# Patient Record
Sex: Female | Born: 1945 | Race: White | Hispanic: No | Marital: Married | Smoking: Never smoker
Health system: Southern US, Community
[De-identification: ages and names within clinical notes are randomized; demographics above are authoritative.]

## PROBLEM LIST (undated history)

## (undated) DIAGNOSIS — E079 Disorder of thyroid, unspecified: Secondary | ICD-10-CM

## (undated) DIAGNOSIS — E039 Hypothyroidism, unspecified: Secondary | ICD-10-CM

## (undated) DIAGNOSIS — M1990 Unspecified osteoarthritis, unspecified site: Secondary | ICD-10-CM

## (undated) DIAGNOSIS — I10 Essential (primary) hypertension: Secondary | ICD-10-CM

## (undated) HISTORY — PX: JOINT REPLACEMENT: SHX530

---

## 2013-02-16 ENCOUNTER — Emergency Department: Payer: Self-pay | Admitting: Emergency Medicine

## 2013-02-16 LAB — URINALYSIS, COMPLETE
Bilirubin,UR: NEGATIVE
Glucose,UR: NEGATIVE mg/dL (ref 0–75)
Ketone: NEGATIVE
Nitrite: NEGATIVE
Protein: 100
RBC,UR: 1089 /HPF (ref 0–5)
Specific Gravity: 1.005 (ref 1.003–1.030)
Squamous Epithelial: <1

## 2013-08-05 ENCOUNTER — Ambulatory Visit: Payer: Self-pay | Admitting: Family Medicine

## 2015-03-04 ENCOUNTER — Encounter (HOSPITAL_COMMUNITY): Payer: Self-pay | Admitting: Emergency Medicine

## 2015-03-04 ENCOUNTER — Emergency Department (HOSPITAL_COMMUNITY)
Admission: EM | Admit: 2015-03-04 | Discharge: 2015-03-04 | Disposition: A | Discharge status: Home / Self Care | Payer: Medicare Other | Attending: Emergency Medicine | Admitting: Emergency Medicine

## 2015-03-04 DIAGNOSIS — Z8639 Personal history of other endocrine, nutritional and metabolic disease: Secondary | ICD-10-CM | POA: Diagnosis not present

## 2015-03-04 DIAGNOSIS — N390 Urinary tract infection, site not specified: Secondary | ICD-10-CM | POA: Diagnosis not present

## 2015-03-04 DIAGNOSIS — I10 Essential (primary) hypertension: Secondary | ICD-10-CM | POA: Diagnosis not present

## 2015-03-04 DIAGNOSIS — R350 Frequency of micturition: Secondary | ICD-10-CM | POA: Diagnosis present

## 2015-03-04 DIAGNOSIS — Z8739 Personal history of other diseases of the musculoskeletal system and connective tissue: Secondary | ICD-10-CM | POA: Insufficient documentation

## 2015-03-04 DIAGNOSIS — E039 Hypothyroidism, unspecified: Secondary | ICD-10-CM | POA: Insufficient documentation

## 2015-03-04 HISTORY — DX: Unspecified osteoarthritis, unspecified site: M19.90

## 2015-03-04 HISTORY — DX: Disorder of thyroid, unspecified: E07.9

## 2015-03-04 HISTORY — DX: Essential (primary) hypertension: I10

## 2015-03-04 HISTORY — DX: Hypothyroidism, unspecified: E03.9

## 2015-03-04 LAB — URINALYSIS, ROUTINE W REFLEX MICROSCOPIC
Bilirubin Urine: NEGATIVE
Glucose, UA: NEGATIVE mg/dL
Ketones, ur: NEGATIVE mg/dL
Nitrite: NEGATIVE
PH: 6.5 (ref 5.0–8.0)
Protein, ur: NEGATIVE mg/dL
SPECIFIC GRAVITY, URINE: 1.008 (ref 1.005–1.030)
UROBILINOGEN UA: 0.2 mg/dL (ref 0.0–1.0)

## 2015-03-04 LAB — URINE MICROSCOPIC-ADD ON

## 2015-03-04 MED ORDER — CEPHALEXIN 500 MG PO CAPS: 500.0000 mg | ORAL_CAPSULE | Freq: Four times a day (QID) | ORAL | Status: DC

## 2015-03-04 MED ORDER — CEPHALEXIN 250 MG PO CAPS
1000.0000 mg | ORAL_CAPSULE | Freq: Once | ORAL | Status: AC
  Administered 2015-03-04: 1000 mg via ORAL
  Filled 2015-03-04: qty 4

## 2015-03-04 NOTE — Discharge Instructions (Signed)

## 2015-03-04 NOTE — ED Notes (Signed)
Pt reports dysuria and painful urination. Pt states that s/s started yesterday. Pt reports hx of UTI's. Pt denied hematuria. Pt ambulatory to triage, NAD, and oriented at this time in triage.

## 2015-03-04 NOTE — ED Provider Notes (Signed)
CSN: [id number]     Arrival date & time 03/04/15  2002 History   First MD Initiated Contact with Patient 03/04/15 2019     Chief Complaint  Patient presents with  . Urinary Frequency     (Consider location/radiation/quality/duration/timing/severity/associated sxs/prior Treatment) HPI   69 year old female with history of thyroid disease, hypertension presents for evaluation of dysuria. Patient reports gradual onset of burning urination along with urinary frequency and urgency which started yesterday. she has had prior history of UTI and this felt similar. She denies any associated fever, abdominal pain, back pain, hematuria, or rash. Her husband is in the ICU here for the past 4 days and patient decided to come to ED for evaluation of her sxs while she's here. She denies any specific treatment tried.  Past Medical History  Diagnosis Date  . Hypertension   . Thyroid disease   . Hypothyroidism   . Arthritis   . Hypothyroidism hypo   Past Surgical History  Procedure Laterality Date  . Joint replacement  right knee   No family history on file. Social History  Substance Use Topics  . Smoking status: Never Smoker   . Smokeless tobacco: None  . Alcohol Use: 0.6 oz/week    1 Glasses of wine per week   OB History    No data available     Review of Systems  All other systems reviewed and are negative.     Allergies  Review of patient's allergies indicates no known allergies.  Home Medications   Prior to Admission medications   Not on File   BP 165/81 mmHg  Pulse 78  Temp(Src) 97.4 F (36.3 C) (Oral)  Resp 18  Ht 5\' 2"  (1.575 m)  Wt 143 lb (64.864 kg)  BMI 26.15 kg/m2  SpO2 100% Physical Exam  Constitutional: She is oriented to person, place, and time. She appears well-developed and well-nourished. No distress.  Caucasian female, appears stated age, in no acute distress and nontoxic in appearance  HENT:  Head: Atraumatic.  Eyes: Conjunctivae are normal.  Neck:  Neck supple.  Cardiovascular: Normal rate and regular rhythm.   Pulmonary/Chest: Effort normal and breath sounds normal.  Abdominal: Soft. There is no tenderness.  Genitourinary:  No CVA tenderness  Neurological: She is alert and oriented to person, place, and time.  Skin: No rash noted.  Psychiatric: She has a normal mood and affect.  Nursing note and vitals reviewed.   ED Course  Procedures (including critical care time)  Well appearing female here with symptoms of dysuria concerning for urinary tract infection. She is able to give a good history and does not have any other concerning symptoms. She is afebrile with stable vital sign. She is hypertensive with history of hypertension and did not take her medication today. She will take it tonight.  9:03 PM Urine result is consistent with a UTI.  Pt will be treated with keflex.  Urine culture sent.    Labs Review Labs Reviewed  URINALYSIS, ROUTINE W REFLEX MICROSCOPIC (NOT AT [facility]) - Abnormal; Notable for the following:    APPearance CLOUDY (*)    Hgb urine dipstick MODERATE (*)    Leukocytes, UA LARGE (*)    All other components within normal limits  URINE MICROSCOPIC-ADD ON - Abnormal; Notable for the following:    Squamous Epithelial / LPF FEW (*)    Bacteria, UA MANY (*)    All other components within normal limits  URINE CULTURE    Imaging Review No  results found. I have personally reviewed and evaluated these images and lab results as part of my medical decision-making.   EKG Interpretation None      MDM   Final diagnoses:  UTI (lower urinary tract infection)    BP 165/81 mmHg  Pulse 78  Temp(Src) 97.4 F (36.3 C) (Oral)  Resp 18  Ht  (1.575 m)  Wt 143 lb (64.864 kg)  BMI 26.15 kg/m2  SpO2 100%     Fayrene Helper, PA-C 03/04/15 2103  Fayrene Helper, PA-C 03/04/15 2109  Linwood Dibbles, MD 03/04/15 [phone]

## 2015-03-07 LAB — URINE CULTURE
Culture: >=100000
Special Requests: NORMAL

## 2015-03-08 ENCOUNTER — Telehealth (HOSPITAL_COMMUNITY): Payer: Self-pay

## 2015-03-08 NOTE — Telephone Encounter (Signed)
Post ED Visit - Positive Culture Follow-up  Culture report reviewed by antimicrobial stewardship pharmacist:  []  Celedonio MiyamotoJeremy Frens, Pharm.D., BCPS []  Georgina PillionElizabeth Martin, [address] BCPS []  [place] HeightsMinh Pham, [address]Ph[address]., BCPS, AAHIVP []  Estella HuskMichelle Turner, Pharm.D., BCPS, AAHIVP []  [place]unen[place]gristy Reyes, [address] [x] Gretel AcreNick Gazda  Cassie Stewart, [place]arm[place]D.  Positive urine culture Treated with cephalexin, organism sensitive to the same and no further patient follow-up is required at this time.  Ashley JacobsFesterman, Terez Montee C 03/08/2015, 9:37 AM

## 2015-09-11 IMAGING — US ABDOMEN ULTRASOUND
1 series · 14 of 25 positions shown · non-contrast
Comparison: None.

CLINICAL DATA: Abdomen pain.  Evaluate gallstones.

EXAM:
ULTRASOUND ABDOMEN COMPLETE

[Series 1: abdomen ultrasound · 0.28mm/px · 14 of 114 slices shown]
[im 1/114]
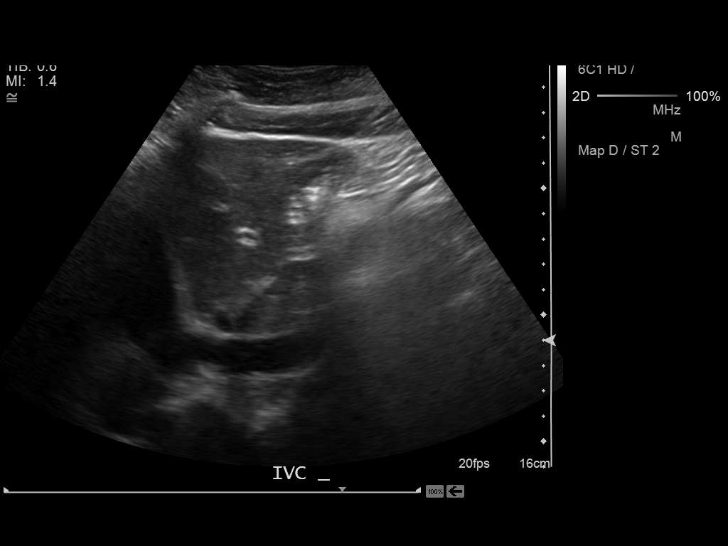
[im 10/114]
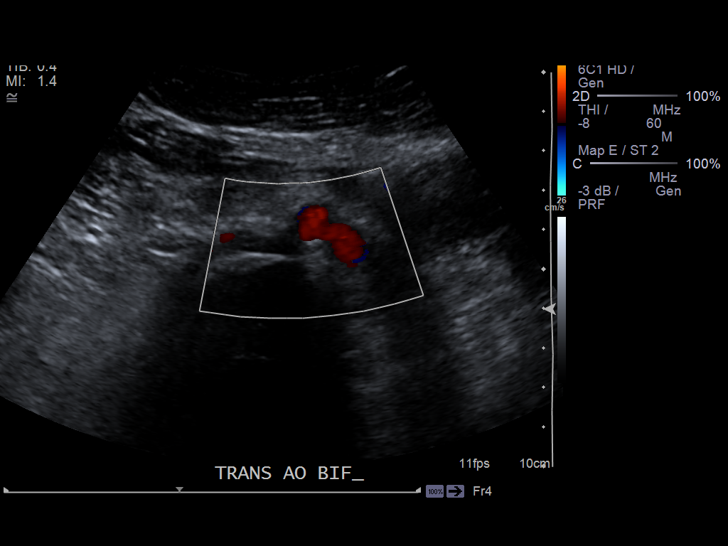
[im 19/114]
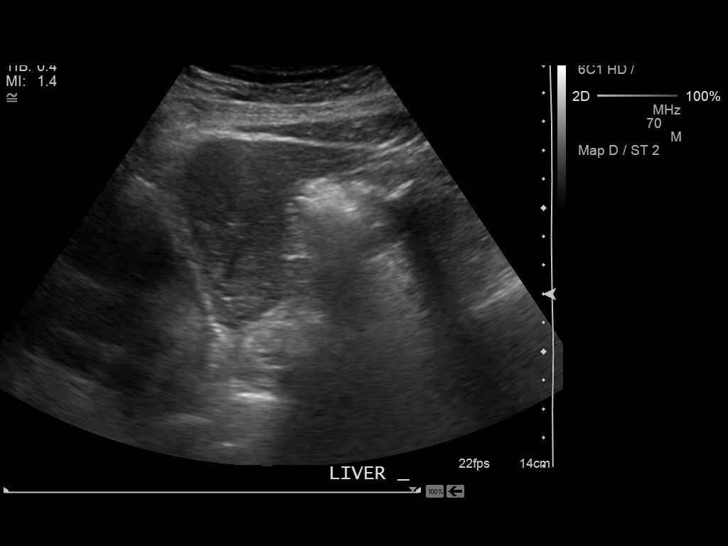
[im 29/114]
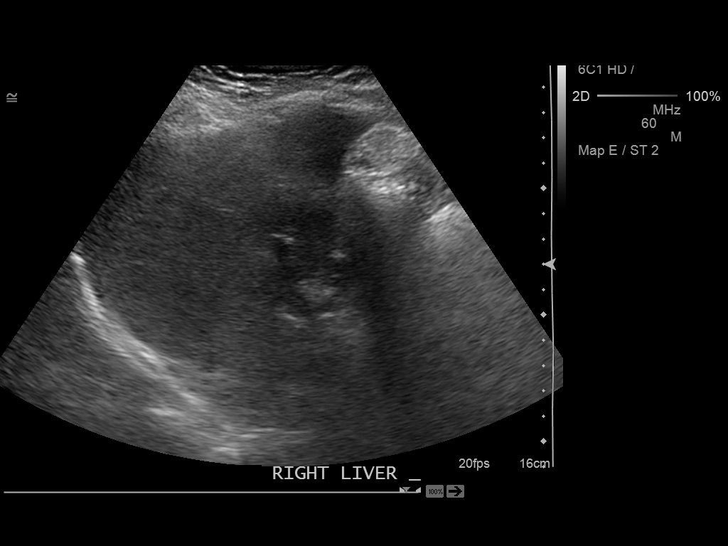
[im 38/114]
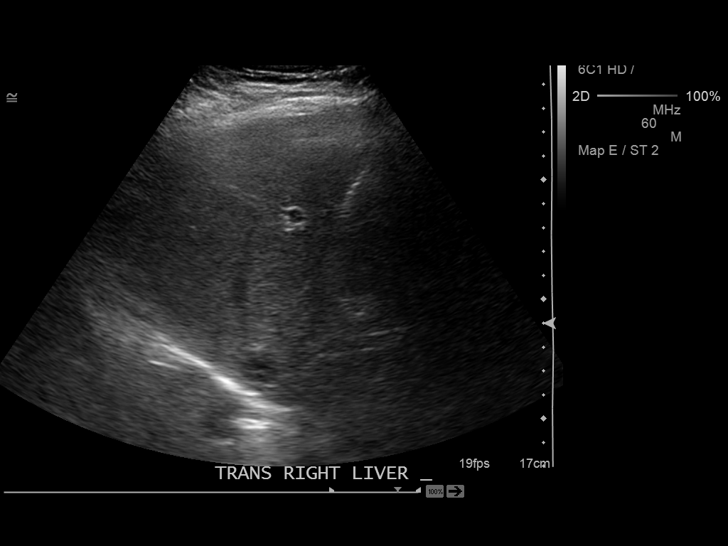
[im 43/114]
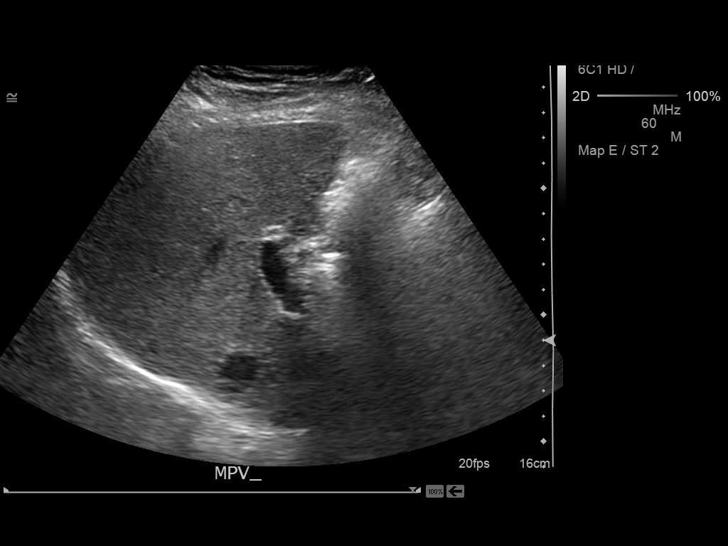
[im 52/114]
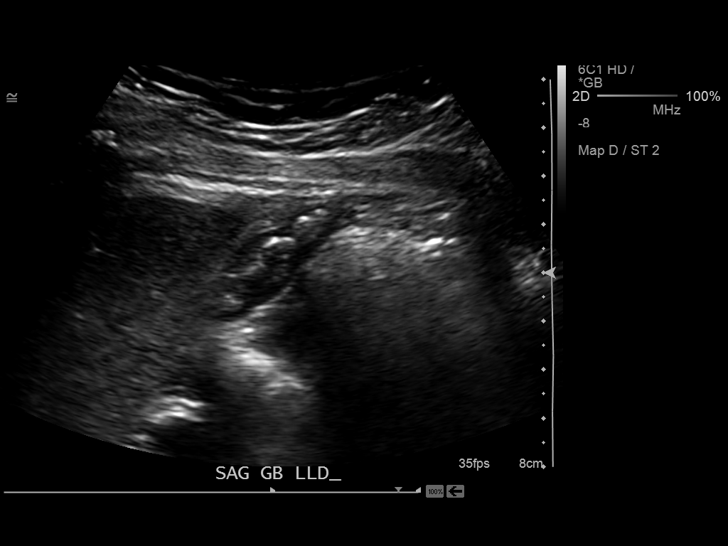
[im 62/114]
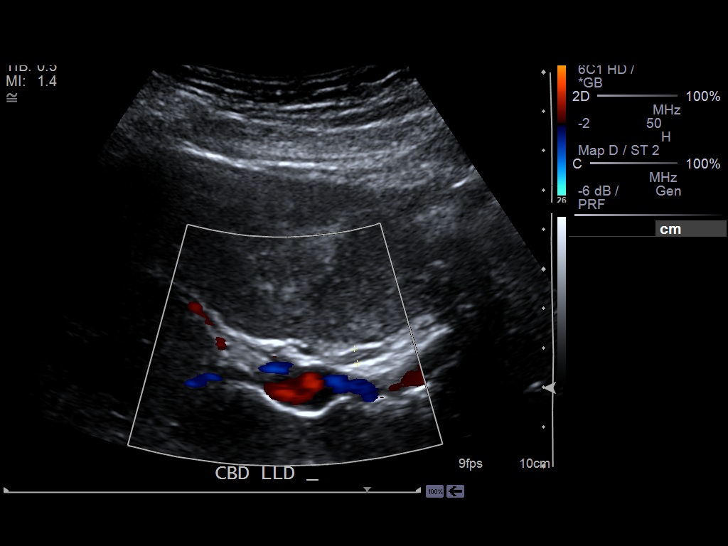
[im 71/114]
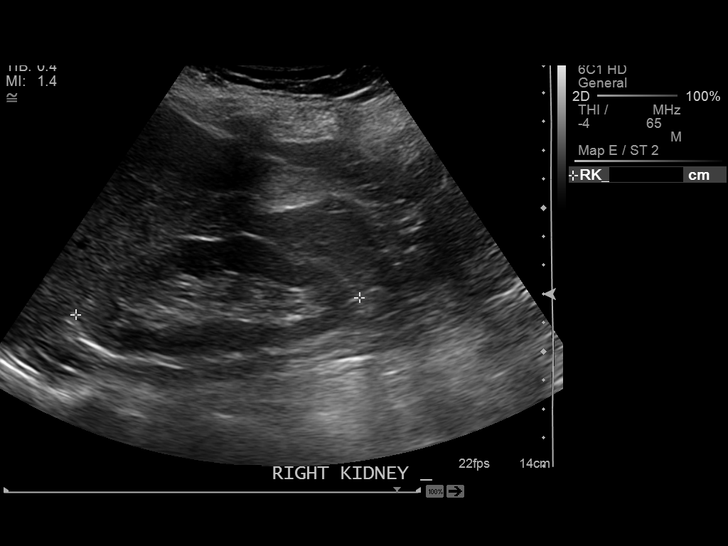
[im 76/114]
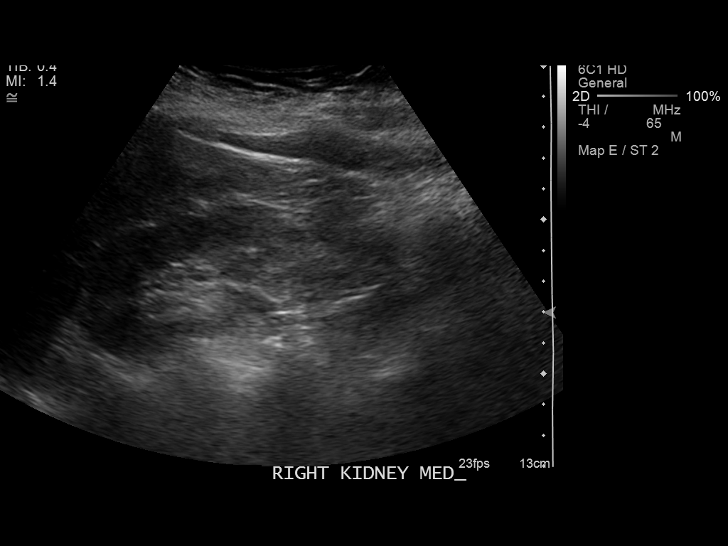
[im 85/114]
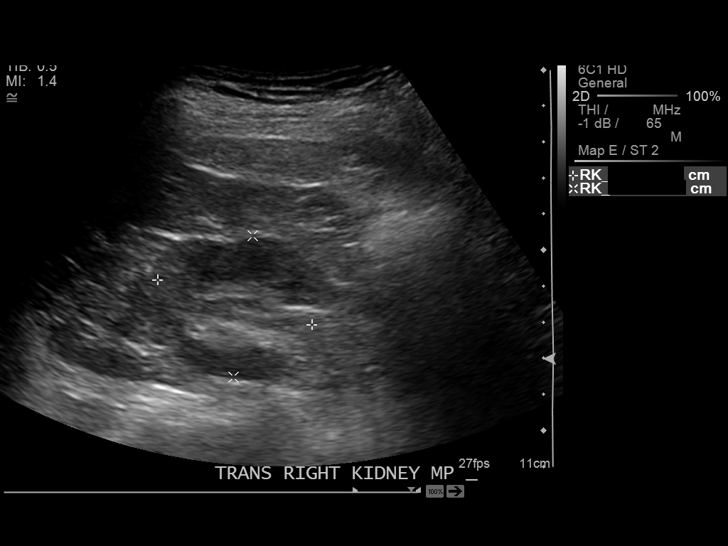
[im 95/114]
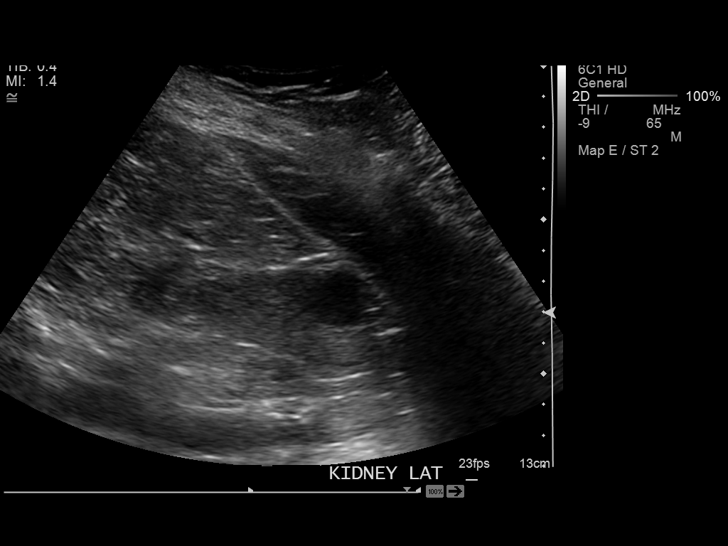
[im 104/114]
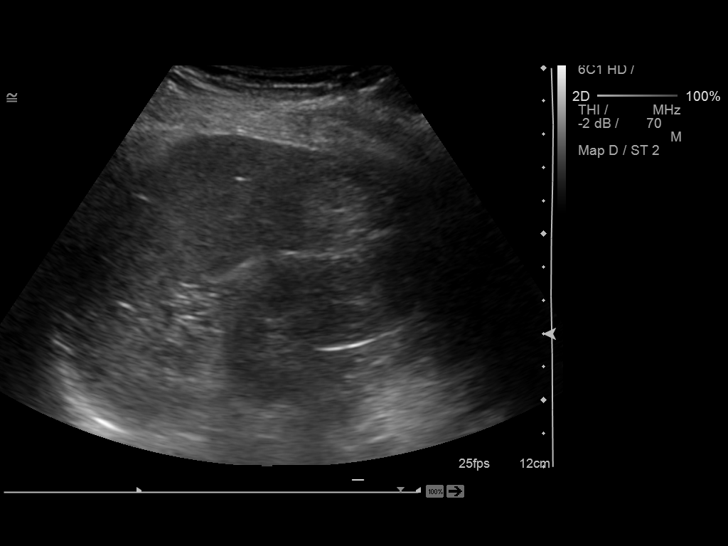
[im 114/114]
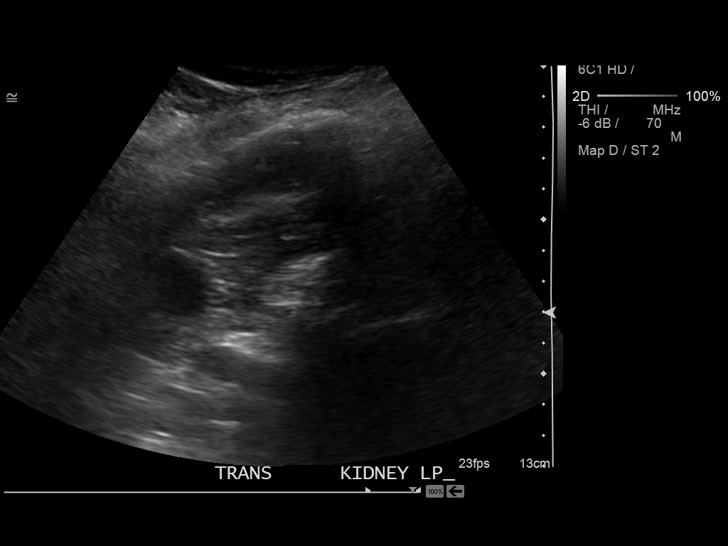

[14 of 25 positions shown; findings below may reference images not displayed]

FINDINGS: Gallbladder:

There are gallstones within the gallbladder. There is no gallbladder
wall thickening or sonographic Murphy sign.

Common bile duct:

Diameter: 3.7 mm

Liver:

No focal lesion identified. Within normal limits in parenchymal
echogenicity.

IVC:

No abnormality visualized.

Pancreas:

Evaluation is limited.

Spleen:

Multiple small echogenic foci question calcified granulomas.

Right Kidney:

Length: 9.9 cm. Echogenicity within normal limits. No hydronephrosis
visualized. There is a 1.1 x 1.1 x 1.4 cm simple cyst in the lower
pole.

Left Kidney:

Length: 9.8 cm. Echogenicity within normal limits. No hydronephrosis
visualized. There is a 2.1 x 2.3 x 1.9 cm simple cyst in the lower
pole.

Abdominal aorta:

No aneurysm visualized.

Other findings:

None.
IMPRESSION: Cholelithiasis without sonographic evidence of acute cholecystitis.

## 2019-10-13 ENCOUNTER — Other Ambulatory Visit: Payer: Self-pay

## 2019-10-13 ENCOUNTER — Ambulatory Visit
Admission: EM | Admit: 2019-10-13 | Discharge: 2019-10-13 | Disposition: A | Discharge status: Home / Self Care | Payer: Medicare Other | Attending: Family Medicine | Admitting: Family Medicine

## 2019-10-13 ENCOUNTER — Encounter: Payer: Self-pay | Admitting: Emergency Medicine

## 2019-10-13 DIAGNOSIS — N390 Urinary tract infection, site not specified: Secondary | ICD-10-CM

## 2019-10-13 LAB — URINALYSIS, COMPLETE (UACMP) WITH MICROSCOPIC
Bilirubin Urine: NEGATIVE
Glucose, UA: NEGATIVE mg/dL
Ketones, ur: NEGATIVE mg/dL
Nitrite: POSITIVE — AB
Protein, ur: 100 mg/dL — AB
RBC / HPF: >50 RBC/hpf (ref 0–5)
Specific Gravity, Urine: 1.02 (ref 1.005–1.030)
WBC, UA: NONE SEEN WBC/hpf (ref 0–5)
pH: 6.5 (ref 5.0–8.0)

## 2019-10-13 MED ORDER — CEPHALEXIN 500 MG PO CAPS: 500.0000 mg | ORAL_CAPSULE | Freq: Two times a day (BID) | ORAL | 0 refills | Status: AC

## 2019-10-13 MED ORDER — CEPHALEXIN 500 MG PO CAPS
500.0000 mg | ORAL_CAPSULE | Freq: Two times a day (BID) | ORAL | 0 refills | Status: DC
Start: 2019-10-13 — End: 2019-10-13

## 2019-10-13 NOTE — ED Triage Notes (Signed)
Pt c/o dysuria, pressure in bladder area. Started yesterday. She states this morning she saw blood in her urine. Denies fever, or lower back pain.

## 2019-10-15 LAB — URINE CULTURE
Culture: >=100000 — AB
Special Requests: NORMAL

## 2019-10-16 NOTE — ED Provider Notes (Signed)
[facility]    CSN: [id number] Arrival date & time: 10/13/19  7782      History   Chief Complaint Chief Complaint  Patient presents with  . Dysuria    HPI Sabrina Grant is a 74 y.o. female.   74 yo female with a c/o burning with urination and pressure in the bladder area since yesterday. Denies any fevers, chills, vomiting, flank pain.    Dysuria   Past Medical History:  Diagnosis Date  . Arthritis   . Hypertension   . Hypothyroidism   . Hypothyroidism hypo  . Thyroid disease     Patient Active Problem List   Diagnosis Date Noted  . Hypothyroidism     Past Surgical History:  Procedure Laterality Date  . JOINT REPLACEMENT  right knee    OB History   No obstetric history on file.      Home Medications    Prior to Admission medications   Medication Sig Start Date End Date Taking? Authorizing Provider  aspirin 81 MG EC tablet Take by mouth.   Yes [provider]  atorvastatin (LIPITOR) 80 MG tablet Take 80 mg by mouth daily. 09/11/19  Yes [provider]  diphenhydrAMINE (BENADRYL) 25 mg capsule Take by mouth.   Yes [provider]  gabapentin (NEURONTIN) 300 MG capsule TAKE 1 CAPSULE BY MOUTH IN  THE EVENING 07/12/19  Yes [provider]  levothyroxine (SYNTHROID) 75 MCG tablet Take by mouth. 08/13/19  Yes [provider]  lisinopril (ZESTRIL) 5 MG tablet Take by mouth. 04/07/19  Yes [provider]  metoprolol succinate (TOPROL-XL) 25 MG 24 hr tablet Take 12.5 mg by mouth daily. 07/06/19  Yes [provider]  omeprazole (PRILOSEC) 20 MG capsule Take 20 mg by mouth daily. 06/28/19  Yes [provider]  ticagrelor (BRILINTA) 90 MG TABS tablet Take by mouth. 04/07/19  Yes [provider]  cephALEXin (KEFLEX) 500 MG capsule Take 1 capsule (500 mg total) by mouth 2 (two) times daily. 10/13/19   Norval Gable, MD    Family History History reviewed. No pertinent family  history.  Social History Social History   Tobacco Use  . Smoking status: Never Smoker  . Smokeless tobacco: Never Used  Substance Use Topics  . Alcohol use: Yes    Alcohol/week: 1.0 standard drinks    Types: 1 Glasses of wine per week  . Drug use: No     Allergies   Patient has no known allergies.   Review of Systems Review of Systems  Genitourinary: Positive for dysuria.     Physical Exam Triage Vital Signs ED Triage Vitals  Enc Vitals Group     BP 10/13/19 0944 (!) 134/106     Pulse Rate 10/13/19 0944 64     Resp 10/13/19 0944 18     Temp 10/13/19 0944 98.4 F (36.9 C)     Temp Source 10/13/19 0944 Oral     SpO2 10/13/19 0944 98 %     Weight 10/13/19 0940 143 lb 1.3 oz (64.9 kg)     Height 10/13/19 0940 5\' 2"  (1.575 m)     Head Circumference --      Peak Flow --      Pain Score 10/13/19 0940 5     Pain Loc --      Pain Edu? --      Excl. in [place]? --    No data found.  Updated Vital Signs BP (!) 134/106 (BP  Location: Left Arm)   Pulse 64   Temp 98.4 F (36.9 C) (Oral)   Resp 18   Ht 5\' 2"  (1.575 m)   Wt 64.9 kg   SpO2 98%   BMI 26.17 kg/m   Visual Acuity Right Eye Distance:   Left Eye Distance:   Bilateral Distance:    Right Eye Near:   Left Eye Near:    Bilateral Near:     Physical Exam Vitals and nursing note reviewed.  Constitutional:      General: She is not in acute distress.    Appearance: She is not toxic-appearing or diaphoretic.  Abdominal:     General: There is no distension.     Palpations: Abdomen is soft.     Tenderness: There is no abdominal tenderness.  Neurological:     Mental Status: She is alert.      UC Treatments / Results  Labs (all labs ordered are listed, but only abnormal results are displayed) Labs Reviewed  URINE CULTURE - Abnormal; Notable for the following components:      Result Value   Culture >=100,000 COLONIES/mL ESCHERICHIA COLI (*)    Organism ID, Bacteria ESCHERICHIA COLI (*)    All other  components within normal limits  URINALYSIS, COMPLETE (UACMP) WITH MICROSCOPIC - Abnormal; Notable for the following components:   APPearance CLOUDY (*)    Hgb urine dipstick LARGE (*)    Protein, ur 100 (*)    Nitrite POSITIVE (*)    Leukocytes,Ua MODERATE (*)    Bacteria, UA FEW (*)    All other components within normal limits    EKG   Radiology No results found.  Procedures Procedures (including critical care time)  Medications Ordered in UC Medications - No data to display  Initial Impression / Assessment and Plan / UC Course  I have reviewed the triage vital signs and the nursing notes.  Pertinent labs & imaging results that were available during my care of the patient were reviewed by me and considered in my medical decision making (see chart for details).      Final Clinical Impressions(s) / UC Diagnoses   Final diagnoses:  Lower urinary tract infectious disease    ED Prescriptions    Medication Sig Dispense Auth. Provider   cephALEXin (KEFLEX) 500 MG capsule  (Status: Discontinued) Take 1 capsule (500 mg total) by mouth 2 (two) times daily. 14 capsule Hannah Crill, MD   cephALEXin (KEFLEX) 500 MG capsule Take 1 capsule (500 mg total) by mouth 2 (two) times daily. 14 capsule , MD      1. Lab results and diagnosis reviewed with patient 2. rx as per orders above; reviewed possible side effects, interactions, risks and benefits  3. Recommend supportive treatment with increased water intake 4. Follow-up prn if symptoms worsen or don't improve  PDMP not reviewed this encounter.   Payton Mccallum, MD 10/16/19 [phone]
